# Patient Record
Sex: Male | Born: 1990 | Race: Black or African American | Hispanic: No | Marital: Single | Smoking: Current some day smoker
Health system: Southern US, Community
[De-identification: ages and names within clinical notes are randomized; demographics above are authoritative.]

## PROBLEM LIST (undated history)

## (undated) DIAGNOSIS — F1020 Alcohol dependence, uncomplicated: Secondary | ICD-10-CM

---

## 2012-02-25 ENCOUNTER — Emergency Department (HOSPITAL_BASED_OUTPATIENT_CLINIC_OR_DEPARTMENT_OTHER)
Admission: EM | Admit: 2012-02-25 | Discharge: 2012-02-25 | Disposition: A | Discharge status: Home / Self Care | Payer: Self-pay | Attending: Emergency Medicine | Admitting: Emergency Medicine

## 2012-02-25 ENCOUNTER — Encounter (HOSPITAL_BASED_OUTPATIENT_CLINIC_OR_DEPARTMENT_OTHER): Payer: Self-pay | Admitting: Family Medicine

## 2012-02-25 DIAGNOSIS — J069 Acute upper respiratory infection, unspecified: Secondary | ICD-10-CM | POA: Insufficient documentation

## 2012-02-25 DIAGNOSIS — F17200 Nicotine dependence, unspecified, uncomplicated: Secondary | ICD-10-CM | POA: Insufficient documentation

## 2012-02-25 NOTE — ED Provider Notes (Signed)
History     CSN: [id number]  Arrival date & time 02/25/12  1031   First MD Initiated Contact with Patient 02/25/12 1303      Chief Complaint  Patient presents with  . Cough    (Consider location/radiation/quality/duration/timing/severity/associated sxs/prior treatment) Patient is a 21 y.o. male presenting with cough. The history is provided by the patient. No language interpreter was used.  Cough This is a new problem. The current episode started 2 days ago. The problem occurs constantly. The problem has been gradually worsening. The cough is non-productive. There has been no fever. Pertinent negatives include no chest pain. He has tried nothing for the symptoms. He is not a smoker. His past medical history does not include bronchitis or pneumonia.    History reviewed. No pertinent past medical history.  History reviewed. No pertinent past surgical history.  No family history on file.  History  Substance Use Topics  . Smoking status: Current Every Day Smoker  . Smokeless tobacco: Not on file  . Alcohol Use: Yes      Review of Systems  Respiratory: Positive for cough.   Cardiovascular: Negative for chest pain.  All other systems reviewed and are negative.    Allergies  Review of patient's allergies indicates no known allergies.  Home Medications  No current outpatient prescriptions on file.  BP 115/74  Pulse 97  Temp 98.3 F (36.8 C) (Oral)  Resp 16  Ht 5\' 11"  (1.803 m)  Wt 165 lb (74.844 kg)  BMI 23.01 kg/m2  SpO2 98%  Physical Exam  Nursing note and vitals reviewed. Constitutional: He is oriented to person, place, and time. He appears well-developed and well-nourished.  HENT:  Head: Normocephalic and atraumatic.  Eyes: Conjunctivae normal and EOM are normal. Pupils are equal, round, and reactive to light.  Neck: Normal range of motion. Neck supple.  Cardiovascular: Normal rate and normal heart sounds.   Pulmonary/Chest: Effort normal.  Abdominal:  Soft.  Musculoskeletal: Normal range of motion.  Neurological: He is alert and oriented to person, place, and time. He has normal reflexes.  Skin: Skin is warm.  Psychiatric: He has a normal mood and affect.    ED Course  Procedures (including critical care time)  Labs Reviewed - No data to display No results found.   No diagnosis found.    MDM  Pt advised illness seems viral.  I advised tylenol for backaches.  Follow up if needed       Elson Areas, [place] 02/25/12 1344

## 2012-02-25 NOTE — ED Notes (Signed)
Pt c/o cough, congestion and body aches x 2 days. No fever at present. Denies n/v/d. Breathing is regular and non-labored.

## 2012-02-25 NOTE — ED Provider Notes (Signed)
Medical screening examination/treatment/procedure(s) were performed by non-physician practitioner and as supervising physician I was immediately available for consultation/collaboration.  Jones Skene, M.D.     Jones Skene, MD 02/25/12 [phone]

## 2012-03-06 ENCOUNTER — Emergency Department (HOSPITAL_BASED_OUTPATIENT_CLINIC_OR_DEPARTMENT_OTHER)
Admission: EM | Admit: 2012-03-06 | Discharge: 2012-03-06 | Disposition: A | Discharge status: Home / Self Care | Payer: Self-pay | Attending: Emergency Medicine | Admitting: Emergency Medicine

## 2012-03-06 ENCOUNTER — Encounter (HOSPITAL_BASED_OUTPATIENT_CLINIC_OR_DEPARTMENT_OTHER): Payer: Self-pay | Admitting: *Deleted

## 2012-03-06 ENCOUNTER — Emergency Department (HOSPITAL_BASED_OUTPATIENT_CLINIC_OR_DEPARTMENT_OTHER): Payer: Self-pay

## 2012-03-06 DIAGNOSIS — Y92009 Unspecified place in unspecified non-institutional (private) residence as the place of occurrence of the external cause: Secondary | ICD-10-CM | POA: Insufficient documentation

## 2012-03-06 DIAGNOSIS — S60229A Contusion of unspecified hand, initial encounter: Secondary | ICD-10-CM | POA: Insufficient documentation

## 2012-03-06 DIAGNOSIS — W2209XA Striking against other stationary object, initial encounter: Secondary | ICD-10-CM | POA: Insufficient documentation

## 2012-03-06 DIAGNOSIS — T148XXA Other injury of unspecified body region, initial encounter: Secondary | ICD-10-CM

## 2012-03-06 DIAGNOSIS — Y939 Activity, unspecified: Secondary | ICD-10-CM | POA: Insufficient documentation

## 2012-03-06 DIAGNOSIS — F17200 Nicotine dependence, unspecified, uncomplicated: Secondary | ICD-10-CM | POA: Insufficient documentation

## 2012-03-06 MED ORDER — NAPROXEN 375 MG PO TABS: 375.0000 mg | ORAL_TABLET | Freq: Two times a day (BID) | ORAL | Status: DC

## 2012-03-06 NOTE — ED Notes (Signed)
Pt c/o right hand pain after hitting a wall, swelling noted. +PMS

## 2012-03-06 NOTE — ED Notes (Signed)
MD at bedside. 

## 2012-03-06 NOTE — ED Provider Notes (Signed)
History     CSN: [id number]  Arrival date & time 03/06/12  0157   First MD Initiated Contact with Patient 03/06/12 0407      Chief Complaint  Patient presents with  . Hand Injury    (Consider location/radiation/quality/duration/timing/severity/associated sxs/prior treatment) Patient is a 22 y.o. male presenting with hand injury. The history is provided by the patient. No language interpreter was used.  Hand Injury  The incident occurred 1 to 2 hours ago. The incident occurred at home. Injury mechanism: hit a wall with his right hand. The pain is present in the right hand. The quality of the pain is described as aching. The pain is severe. The pain has been constant since the incident. Pertinent negatives include no fever. He reports no foreign bodies present. The symptoms are aggravated by movement and palpation. He has tried nothing for the symptoms. The treatment provided no relief.    History reviewed. No pertinent past medical history.  History reviewed. No pertinent past surgical history.  History reviewed. No pertinent family history.  History  Substance Use Topics  . Smoking status: Current Every Day Smoker  . Smokeless tobacco: Not on file  . Alcohol Use: Yes      Review of Systems  Constitutional: Negative for fever.  All other systems reviewed and are negative.    Allergies  Review of patient's allergies indicates no known allergies.  Home Medications  No current outpatient prescriptions on file.  BP 141/69  Pulse 98  Temp 98.4 F (36.9 C) (Oral)  Resp 18  Ht 5\' 11"  (1.803 m)  Wt 165 lb (74.844 kg)  BMI 23.01 kg/m2  SpO2 99%  Physical Exam  Constitutional: He is oriented to person, place, and time. He appears well-developed and well-nourished. No distress.  HENT:  Head: Normocephalic and atraumatic.  Eyes: Conjunctivae normal and EOM are normal.  Neck: Normal range of motion. Neck supple.  Cardiovascular: Normal rate, regular rhythm and intact  distal pulses.   Pulmonary/Chest: Effort normal and breath sounds normal. He has no wheezes. He has no rales.  Abdominal: Soft. Bowel sounds are normal. There is no tenderness. There is no rebound and no guarding.  Musculoskeletal: Normal range of motion.       No snuff box tenderness on the right FROM of the right hand right hand neurovascularly intact cap refill < 2 sec to all fingers.  Contusions to the DIP of 4 fingers  Neurological: He is alert and oriented to person, place, and time. He has normal reflexes.  Skin: Skin is warm and dry.  Psychiatric: He has a normal mood and affect.    ED Course  Procedures (including critical care time)  Labs Reviewed - No data to display Dg Hand Complete Right  03/06/2012  *RADIOLOGY REPORT*  Clinical Data: Right hand injury.  RIGHT HAND - COMPLETE 3+ VIEW  Comparison: None.  Findings: There is no evidence of fracture or dislocation.  The joint spaces are preserved; the soft tissues are unremarkable in appearance.  The carpal rows are intact, and demonstrate normal alignment.  Mild negative ulnar variance is noted.  IMPRESSION: No evidence of fracture or dislocation.   Original Report Authenticated By: Tonia Ghent, M.D.      No diagnosis found.    MDM  Ice elevate NSAIDs for pain       Nelma Phagan K Danessa Mensch-Rasch, MD 03/06/12 [phone]

## 2012-11-12 ENCOUNTER — Emergency Department (HOSPITAL_BASED_OUTPATIENT_CLINIC_OR_DEPARTMENT_OTHER)
Admission: EM | Admit: 2012-11-12 | Discharge: 2012-11-12 | Disposition: A | Discharge status: Home / Self Care | Payer: Self-pay | Attending: Emergency Medicine | Admitting: Emergency Medicine

## 2012-11-12 ENCOUNTER — Encounter (HOSPITAL_BASED_OUTPATIENT_CLINIC_OR_DEPARTMENT_OTHER): Payer: Self-pay | Admitting: *Deleted

## 2012-11-12 DIAGNOSIS — Z791 Long term (current) use of non-steroidal anti-inflammatories (NSAID): Secondary | ICD-10-CM | POA: Insufficient documentation

## 2012-11-12 DIAGNOSIS — R05 Cough: Secondary | ICD-10-CM | POA: Insufficient documentation

## 2012-11-12 DIAGNOSIS — R059 Cough, unspecified: Secondary | ICD-10-CM | POA: Insufficient documentation

## 2012-11-12 DIAGNOSIS — F17200 Nicotine dependence, unspecified, uncomplicated: Secondary | ICD-10-CM | POA: Insufficient documentation

## 2012-11-12 DIAGNOSIS — R51 Headache: Secondary | ICD-10-CM | POA: Insufficient documentation

## 2012-11-12 DIAGNOSIS — R071 Chest pain on breathing: Secondary | ICD-10-CM | POA: Insufficient documentation

## 2012-11-12 DIAGNOSIS — Z113 Encounter for screening for infections with a predominantly sexual mode of transmission: Secondary | ICD-10-CM | POA: Insufficient documentation

## 2012-11-12 DIAGNOSIS — R0789 Other chest pain: Secondary | ICD-10-CM | POA: Insufficient documentation

## 2012-11-12 LAB — URINALYSIS, ROUTINE W REFLEX MICROSCOPIC
Bilirubin Urine: NEGATIVE
Ketones, ur: NEGATIVE mg/dL
Nitrite: NEGATIVE
Specific Gravity, Urine: 1.024 (ref 1.005–1.030)
Urobilinogen, UA: 0.2 mg/dL (ref 0.0–1.0)

## 2012-11-12 MED ORDER — IBUPROFEN 200 MG PO TABS
600.0000 mg | ORAL_TABLET | Freq: Once | ORAL | Status: AC
  Administered 2012-11-12: 600 mg via ORAL
  Filled 2012-11-12: qty 1

## 2012-11-12 MED ORDER — IBUPROFEN 600 MG PO TABS: 600.0000 mg | ORAL_TABLET | Freq: Four times a day (QID) | ORAL | Status: DC | PRN

## 2012-11-12 NOTE — ED Provider Notes (Signed)
CSN: [id number]     Arrival date & time 11/12/12  1827 History   First MD Initiated Contact with Patient 11/12/12 1926     Chief Complaint  Patient presents with  . Headache   (Consider location/radiation/quality/duration/timing/severity/associated sxs/prior Treatment) HPI Patient presents with several complaints. Personally to be screened for STDs. The patient's history is such that with one partner doesn't use contraception. His partner has not been diagnosed with transmitted disease. He has never been diagnosed with an essentially transmitted disease. He denies penal discharge, dysuria, testicular pain or swelling.  Patient also complains of a mild left parietal headache that started roughly one hour ago. Patient has had similar headaches to this in the past. Headache was gradual onset. He has no photophobia nausea vomiting. He has no weakness vision changes or numbness. Patient states it has been several hours since he last had a cigarette. He routinely gets headaches similar to this when he does not smoke extended period.  Patient also complains of 2 weeks of left lower rib pain. Does not remember any definite trauma. Has no shortness of breath but does have a mild cough. There is no yellow-green sputum production. He has no fevers or chills. Pain is worse with palpation of the left lateral ribs. He has no portion of the swelling, no recent travel no surgeries.Factors for DVT/PE.  History reviewed. No pertinent past medical history. History reviewed. No pertinent past surgical history. No family history on file. History  Substance Use Topics  . Smoking status: Current Some Day Smoker    Types: Cigarettes  . Smokeless tobacco: Not on file  . Alcohol Use: Yes    Review of Systems  Constitutional: Negative for fever and chills.  HENT: Negative for sore throat, neck pain and neck stiffness.   Respiratory: Positive for cough. Negative for shortness of breath.   Cardiovascular: Positive  for chest pain. Negative for palpitations and leg swelling.  Gastrointestinal: Negative for nausea, vomiting and abdominal pain.  Genitourinary: Negative for dysuria, discharge, penile swelling, scrotal swelling, penile pain and testicular pain.  Musculoskeletal: Negative for myalgias and back pain.  Skin: Negative for rash and wound.  Neurological: Positive for headaches. Negative for dizziness, weakness, light-headedness and numbness.  All other systems reviewed and are negative.    Allergies  Review of patient's allergies indicates no known allergies.  Home Medications   Current Outpatient Rx  Name  Route  Sig  Dispense  Refill  . naproxen (NAPROSYN) 375 MG tablet   Oral   Take 1 tablet (375 mg total) by mouth 2 (two) times daily.   20 tablet   0    BP 144/75  Pulse 80  Temp(Src) 98.9 F (37.2 C) (Oral)  Resp 16  Ht 5\' 11"  (1.803 m)  Wt 180 lb (81.647 kg)  BMI 25.12 kg/m2  SpO2 99% Physical Exam  Nursing note and vitals reviewed. Constitutional: He is oriented to person, place, and time. He appears well-developed and well-nourished. No distress.  HENT:  Head: Normocephalic and atraumatic.  Mouth/Throat: Oropharynx is clear and moist. No oropharyngeal exudate.  Eyes: EOM are normal. Pupils are equal, round, and reactive to light.  Neck: Normal range of motion. Neck supple.  No meningeal signs  Cardiovascular: Normal rate and regular rhythm.   Pulmonary/Chest: Effort normal and breath sounds normal. No respiratory distress. He has no wheezes. He has no rales. He exhibits tenderness (mild tenderness to palpation of his left lower lateral ribs. No obvious deformity or crepitance.).  Abdominal: Soft. Bowel sounds are normal. He exhibits no distension and no mass. There is no tenderness. There is no rebound and no guarding.  Genitourinary: Penis normal.  No penal or testicular pain. The penal discharge. No inguinal lymphadenopathy.  Musculoskeletal: Normal range of motion.  He exhibits no edema and no tenderness.  Lymphadenopathy:    He has no cervical adenopathy.  Neurological: He is alert and oriented to person, place, and time.  Patient is alert and oriented x3 with clear, goal oriented speech. Patient has 5/5 motor in all extremities. Sensation is intact to light touch. Patient has a normal gait and walks without assistance.   Skin: Skin is warm and dry. No rash noted. No erythema.  Psychiatric: He has a normal mood and affect. His behavior is normal.    ED Course  Procedures (including critical care time) Labs Review Labs Reviewed  GC/CHLAMYDIA PROBE AMP  URINALYSIS, ROUTINE W REFLEX MICROSCOPIC   Imaging Review No results found.  MDM  GC and Chlamydia cultures obtained. Patient advised to call to medical records in 3 days results. Lateral chest pain likely rib contusion versus muscle strain. Do not suspect pneumonia or PE. Headache but no concerning features. Possibly due to nicotine withdrawal. We'll treat chest pain and headache with NSAIDs. Return cautions have been given   Loren Racer, MD 11/12/12 [phone]

## 2012-11-12 NOTE — ED Notes (Signed)
Pt reports he is here to be checked for an STD- denies any penile drainage or painful urination

## 2012-11-12 NOTE — ED Notes (Signed)
Headache. Wants a STD check.

## 2013-10-04 ENCOUNTER — Encounter (HOSPITAL_BASED_OUTPATIENT_CLINIC_OR_DEPARTMENT_OTHER): Payer: Self-pay | Admitting: Emergency Medicine

## 2013-10-04 ENCOUNTER — Emergency Department (HOSPITAL_BASED_OUTPATIENT_CLINIC_OR_DEPARTMENT_OTHER)
Admission: EM | Admit: 2013-10-04 | Discharge: 2013-10-04 | Disposition: A | Discharge status: Home / Self Care | Payer: Self-pay | Attending: Emergency Medicine | Admitting: Emergency Medicine

## 2013-10-04 DIAGNOSIS — T675XXA Heat exhaustion, unspecified, initial encounter: Secondary | ICD-10-CM | POA: Insufficient documentation

## 2013-10-04 DIAGNOSIS — Z791 Long term (current) use of non-steroidal anti-inflammatories (NSAID): Secondary | ICD-10-CM | POA: Insufficient documentation

## 2013-10-04 DIAGNOSIS — R55 Syncope and collapse: Secondary | ICD-10-CM | POA: Insufficient documentation

## 2013-10-04 DIAGNOSIS — X30XXXA Exposure to excessive natural heat, initial encounter: Secondary | ICD-10-CM | POA: Insufficient documentation

## 2013-10-04 DIAGNOSIS — Y9389 Activity, other specified: Secondary | ICD-10-CM | POA: Insufficient documentation

## 2013-10-04 DIAGNOSIS — Y9289 Other specified places as the place of occurrence of the external cause: Secondary | ICD-10-CM | POA: Insufficient documentation

## 2013-10-04 DIAGNOSIS — R1013 Epigastric pain: Secondary | ICD-10-CM | POA: Insufficient documentation

## 2013-10-04 DIAGNOSIS — F17200 Nicotine dependence, unspecified, uncomplicated: Secondary | ICD-10-CM | POA: Insufficient documentation

## 2013-10-04 DIAGNOSIS — Y990 Civilian activity done for income or pay: Secondary | ICD-10-CM | POA: Insufficient documentation

## 2013-10-04 LAB — URINALYSIS, ROUTINE W REFLEX MICROSCOPIC
Bilirubin Urine: NEGATIVE
Glucose, UA: NEGATIVE mg/dL
Hgb urine dipstick: NEGATIVE
Ketones, ur: NEGATIVE mg/dL
LEUKOCYTES UA: NEGATIVE
NITRITE: NEGATIVE
PROTEIN: NEGATIVE mg/dL
SPECIFIC GRAVITY, URINE: 1.011 (ref 1.005–1.030)
UROBILINOGEN UA: 1 mg/dL (ref 0.0–1.0)
pH: 5.5 (ref 5.0–8.0)

## 2013-10-04 LAB — LIPASE, BLOOD: LIPASE: 19 U/L (ref 11–59)

## 2013-10-04 LAB — CBC WITH DIFFERENTIAL/PLATELET
BASOS ABS: 0 10*3/uL (ref 0.0–0.1)
Basophils Relative: 0 % (ref 0–1)
EOS PCT: 1 % (ref 0–5)
Eosinophils Absolute: 0.1 10*3/uL (ref 0.0–0.7)
HCT: 43.7 % (ref 39.0–52.0)
Hemoglobin: 15 g/dL (ref 13.0–17.0)
LYMPHS ABS: 2.8 10*3/uL (ref 0.7–4.0)
LYMPHS PCT: 27 % (ref 12–46)
MCH: 27.4 pg (ref 26.0–34.0)
MCHC: 34.3 g/dL (ref 30.0–36.0)
MCV: 79.9 fL (ref 78.0–100.0)
Monocytes Absolute: 0.9 10*3/uL (ref 0.1–1.0)
Monocytes Relative: 9 % (ref 3–12)
NEUTROS ABS: 6.5 10*3/uL (ref 1.7–7.7)
NEUTROS PCT: 63 % (ref 43–77)
PLATELETS: 288 10*3/uL (ref 150–400)
RBC: 5.47 MIL/uL (ref 4.22–5.81)
RDW: 13.2 % (ref 11.5–15.5)
WBC: 10.4 10*3/uL (ref 4.0–10.5)

## 2013-10-04 LAB — COMPREHENSIVE METABOLIC PANEL
ALK PHOS: 55 U/L (ref 39–117)
ALT: 20 U/L (ref 0–53)
ANION GAP: 14 (ref 5–15)
AST: 26 U/L (ref 0–37)
Albumin: 4.3 g/dL (ref 3.5–5.2)
BUN: 9 mg/dL (ref 6–23)
CALCIUM: 10.1 mg/dL (ref 8.4–10.5)
CO2: 25 meq/L (ref 19–32)
Chloride: 102 mEq/L (ref 96–112)
Creatinine, Ser: 1.1 mg/dL (ref 0.50–1.35)
GFR calc Af Amer: >90 mL/min (ref >90)
GLUCOSE: 86 mg/dL (ref 70–99)
POTASSIUM: 3.8 meq/L (ref 3.7–5.3)
SODIUM: 141 meq/L (ref 137–147)
Total Bilirubin: 0.3 mg/dL (ref 0.3–1.2)
Total Protein: 7.5 g/dL (ref 6.0–8.3)

## 2013-10-04 LAB — CK: Total CK: 341 U/L — ABNORMAL HIGH (ref 7–232)

## 2013-10-04 MED ORDER — SODIUM CHLORIDE 0.9 % IV BOLUS (SEPSIS)
1000.0000 mL | Freq: Once | INTRAVENOUS | Status: AC
  Administered 2013-10-04: 1000 mL via INTRAVENOUS

## 2013-10-04 NOTE — Discharge Instructions (Signed)
Drink plenty of fluids and get plenty of rest for the next 2 days.  Return to the emergency department if he develops severe pain, bloody stool, high fever, or other new and concerning symptoms.   Syncope Syncope is a medical term for fainting or passing out. This means you lose consciousness and drop to the ground. People are generally unconscious for less than 5 minutes. You may have some muscle twitches for up to 15 seconds before waking up and returning to normal. Syncope occurs more often in older adults, but it can happen to anyone. While most causes of syncope are not dangerous, syncope can be a sign of a serious medical problem. It is important to seek medical care.  CAUSES  Syncope is caused by a sudden drop in blood flow to the brain. The specific cause is often not determined. Factors that can bring on syncope include:  Taking medicines that lower blood pressure.  Sudden changes in posture, such as standing up quickly.  Taking more medicine than prescribed.  Standing in one place for too long.  Seizure disorders.  Dehydration and excessive exposure to heat.  Low blood sugar (hypoglycemia).  Straining to have a bowel movement.  Heart disease, irregular heartbeat, or other circulatory problems.  Fear, emotional distress, seeing blood, or severe pain. SYMPTOMS  Right before fainting, you may:  Feel dizzy or light-headed.  Feel nauseous.  See all white or all black in your field of vision.  Have cold, clammy skin. DIAGNOSIS  Your health care provider will ask about your symptoms, perform a physical exam, and perform an electrocardiogram (ECG) to record the electrical activity of your heart. Your health care provider may also perform other heart or blood tests to determine the cause of your syncope which may include:  Transthoracic echocardiogram (TTE). During echocardiography, sound waves are used to evaluate how blood flows through your heart.  Transesophageal  echocardiogram (TEE).  Cardiac monitoring. This allows your health care provider to monitor your heart rate and rhythm in real time.  Holter monitor. This is a portable device that records your heartbeat and can help diagnose heart arrhythmias. It allows your health care provider to track your heart activity for several days, if needed.  Stress tests by exercise or by giving medicine that makes the heart beat faster. TREATMENT  In most cases, no treatment is needed. Depending on the cause of your syncope, your health care provider may recommend changing or stopping some of your medicines. HOME CARE INSTRUCTIONS  Have someone stay with you until you feel stable.  Do not drive, use machinery, or play sports until your health care provider says it is okay.  Keep all follow-up appointments as directed by your health care provider.  Lie down right away if you start feeling like you might faint. Breathe deeply and steadily. Wait until all the symptoms have passed.  Drink enough fluids to keep your urine clear or pale yellow.  If you are taking blood pressure or heart medicine, get up slowly and take several minutes to sit and then stand. This can reduce dizziness. SEEK IMMEDIATE MEDICAL CARE IF:   You have a severe headache.  You have unusual pain in the chest, abdomen, or back.  You are bleeding from your mouth or rectum, or you have black or tarry stool.  You have an irregular or very fast heartbeat.  You have pain with breathing.  You have repeated fainting or seizure-like jerking during an episode.  You faint when  sitting or lying down.  You have confusion.  You have trouble walking.  You have severe weakness.  You have vision problems. If you fainted, call your local emergency services (911 in [place] Do not drive yourself to the hospital.  MAKE SURE YOU:  Understand these instructions.  Will watch your condition.  Will get help right away if you are not doing well  or get worse. Document Released: 02/18/2005 Document Revised: 02/23/2013 Document Reviewed: 04/19/2011 [facility] Patient Information 2015 [place], [place]. This information is not intended to replace advice given to you by your health care provider. Make sure you discuss any questions you have with your health care provider.  Heat-Related Illness Heat-related illnesses occur when the body is unable to properly cool itself. The body normally cools itself by sweating. However, under some conditions sweating is not enough. In these cases, a person's body temperature rises rapidly. Very high body temperatures may damage the brain or other vital organs. Some examples of heat-related illnesses include:  Heat stroke. This occurs when the body is unable to regulate its temperature. The body's temperature rises rapidly, the sweating mechanism fails, and the body is unable to cool down. Body temperature may rise to 106 F (41 C) or higher within 10 to 15 minutes. Heat stroke can cause death or permanent disability if emergency treatment is not provided.  Heat exhaustion. This is a milder form of heat-related illness that can develop after several days of exposure to high temperatures and not enough fluids. It is the body's response to an excessive loss of the water and salt contained in sweat.  Heat cramps. These usually affect people who sweat a lot during heavy activity. This sweating drains the body's salt and moisture. The low salt level in the muscles causes painful cramps. Heat cramps may also be a symptom of heat exhaustion. Heat cramps usually occur in the abdomen, arms, or legs. Get medical attention for cramps if you have heart problems or are on a low-sodium diet. Those that are at greatest risk for heat-related illnesses include:   The elderly.  Infant and the very young.  People with mental illness and chronic diseases.  People who are overweight (obese).  Young and healthy people can even  succumb to heat if they participate in strenuous physical activities during hot weather. CAUSES  Several factors affect the body's ability to cool itself during extremely hot weather. When the humidity is high, sweat will not evaporate as quickly. This prevents the body from releasing heat quickly. Other factors that can affect the body's ability to cool down include:   Age.  Obesity.  Fever.  Dehydration.  Heart disease.  Mental illness.  Poor circulation.  Sunburn.  Prescription drug use.  Alcohol use. SYMPTOMS  Heat stroke: Warning signs of heat stroke vary, but may include:  An extremely high body temperature (above 103F orally).  A fast, strong pulse.  Dizziness.  Confusion.  Red, hot, and dry skin.  No sweating.  Throbbing headache.  Feeling sick to your stomach (nauseous).  Unconsciousness. Heat exhaustion: Warning signs of heat exhaustion include:  Heavy sweating.  Tiredness.  Headache.  Paleness.  Weakness.  Feeling sick to your stomach (nauseous) or vomiting.  Muscle cramps. Heat cramps  Muscle pains or spasms. TREATMENT  Heat stroke  Get into a cool environment. An indoor place that is air-conditioned may be best.  Take a cool shower or bath. Have someone around to make sure you are okay.  Take your temperature.  Make sure it is going down. Heat exhaustion  Drink plenty of fluids. Do not drink liquids that contain caffeine, alcohol, or large amounts of sugar. These cause you to lose more body fluid. Also, avoid very cold drinks. They can cause stomach cramps.  Get into a cool environment. An indoor place that is air-conditioned may be best.  Take a cool shower or bath. Have someone around to make sure you are okay.  Put on lightweight clothing. Heat cramps  Stop whatever activity you were doing. Do not attempt to do that activity for at least 3 hours after the cramps have gone away.  Get into a cool environment. An indoor  place that is air-conditioned may be best. HOME CARE INSTRUCTIONS  To protect your health when temperatures are extremely high, follow these tips:  During heavy exercise in a hot environment, drink two to four glasses (16-32 ounces) of cool fluids each hour. Do not wait until you are thirsty to drink. Warning: If your caregiver limits the amount of fluid you drink or has you on water pills, ask how much you should drink while the weather is hot.  Do not drink liquids that contain caffeine, alcohol, or large amounts of sugar. These cause you to lose more body fluid.  Avoid very cold drinks. They can cause stomach cramps.  Wear appropriate clothing. Choose lightweight, light-colored, loose-fitting clothing.  If you must be outdoors, try to limit your outdoor activity to morning and evening hours. Try to rest often in shady areas.  If you are not used to working or exercising in a hot environment, start slowly and pick up the pace gradually.  Stay cool in an air-conditioned place if possible. If your home does not have air conditioning, go to the shopping mall or [employer].  Taking a cool shower or bath may help you cool off. SEEK MEDICAL CARE IF:   You see any of the symptoms listed above. You may be dealing with a life-threatening emergency.  Symptoms worsen or last longer than 1 hour.  Heat cramps do not get better in 1 hour. MAKE SURE YOU:   Understand these instructions.  Will watch your condition.  Will get help right away if you are not doing well or get worse. Document Released: 11/28/2007 Document Revised: 05/13/2011 Document Reviewed: 11/28/2007 [facility] Patient Information 2015 [place], [place]. This information is not intended to replace advice given to you by your health care provider. Make sure you discuss any questions you have with your health care provider.

## 2013-10-04 NOTE — ED Notes (Signed)
MD at bedside. 

## 2013-10-04 NOTE — ED Notes (Signed)
Abdominal pain and "passed out" PTA.  States he ate lunch, went outside at work to [employer] and "passed out".  Unwitnessed. States he was unconscious x 2-3 minutes witnessed.  No previous illness.  States he developed upper abdominal pain radiating to left chest wall.

## 2013-10-04 NOTE — ED Provider Notes (Signed)
CSN: [id number]     Arrival date & time 10/04/13  1232 History   First MD Initiated Contact with Patient 10/04/13 1253     Chief Complaint  Patient presents with  . Abdominal Pain     (Consider location/radiation/quality/duration/timing/severity/associated sxs/prior Treatment) HPI Comments: Patient is a 23 year old male with no significant past medical history. He presents today with complaints of epigastric cramping following an apparent syncopal episode at work. 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 He states he was riding a standing lawnmower when he suddenly blacked out. He was unconscious for approximately 2 minutes and was found by a coworker. He was then brought here for evaluation. He developed the upper abdominal cramping after waking up from this episode. It was quite hot outside today, however he believes he was drinking an adequate amount of fluid and felt well prior to this episode. He denies any biting of the tongue or cheek. He denies any incontinence of stool or urine.  Patient is a 23 y.o. male presenting with abdominal pain. The history is provided by the patient.  Abdominal Pain Pain location:  Epigastric Pain quality: cramping   Pain radiates to:  Does not radiate Pain severity:  Moderate Onset quality:  Sudden Duration:  1 hour Timing:  Constant Progression:  Partially resolved Chronicity:  New Relieved by:  Nothing Worsened by:  Nothing tried Ineffective treatments:  None tried   History reviewed. No pertinent past medical history. History reviewed. No pertinent past surgical history. No family history on file. History  Substance Use Topics  . Smoking status: Current Some Day Smoker    Types: Cigarettes  . Smokeless tobacco: Not on file  . Alcohol Use: Yes    Review of Systems  Gastrointestinal: Positive for abdominal pain.  All other systems reviewed and are negative.     Allergies  Review of patient's allergies  indicates no known allergies.  Home Medications   Prior to Admission medications   Medication Sig Start Date End Date Taking? Authorizing Provider  ibuprofen (ADVIL,MOTRIN) 600 MG tablet Take 1 tablet (600 mg total) by mouth every 6 (six) hours as needed for pain. 11/12/12   Loren Racer, MD  naproxen (NAPROSYN) 375 MG tablet Take 1 tablet (375 mg total) by mouth 2 (two) times daily. 03/06/12   April K Palumbo-Rasch, MD   BP 129/81  Pulse 92  Temp(Src) 98.2 F (36.8 C) (Oral)  Resp 16  Ht 5\' 11"  (1.803 m)  Wt 195 lb (88.451 kg)  BMI 27.21 kg/m2  SpO2 100% Physical Exam  Nursing note and vitals reviewed. Constitutional: He is oriented to person, place, and time. He appears well-developed and well-nourished. No distress.  HENT:  Head: Normocephalic and atraumatic.  Mouth/Throat: Oropharynx is clear and moist.  Eyes: EOM are normal. Pupils are equal, round, and reactive to light.  Neck: Normal range of motion. Neck supple.  Cardiovascular: Normal rate, regular rhythm and normal heart sounds.   No murmur heard. Pulmonary/Chest: Effort normal and breath sounds normal. No respiratory distress. He has no wheezes.  Abdominal: Soft. Bowel sounds are normal. He exhibits no distension. There is tenderness.  There is mild tenderness to palpation in the epigastrium with no rebound and no guarding.  Musculoskeletal: Normal range of motion. He exhibits no edema.  Lymphadenopathy:    He has no cervical adenopathy.  Neurological: He is alert and oriented to person, place, and time.  Skin: Skin is warm and dry. He is not  diaphoretic.    ED Course  Procedures (including critical care time) Labs Review Labs Reviewed  URINALYSIS, ROUTINE W REFLEX MICROSCOPIC - Abnormal; Notable for the following:    APPearance CLOUDY (*)    All other components within normal limits  CBC WITH DIFFERENTIAL  COMPREHENSIVE METABOLIC PANEL  LIPASE, BLOOD  CK    Imaging Review No results found.   EKG  Interpretation   Date/Time:  Monday October 04 2013 13:13:08 EDT Ventricular Rate:  80 PR Interval:  172 QRS Duration: 98 QT Interval:  358 QTC Calculation: 412 R Axis:   56 Text Interpretation:  Normal sinus rhythm Early repolarization Normal ECG  Confirmed by DELOS  MD, Kuba Shepherd ([id number]) on 10/04/2013 1:22:10 PM      MDM   Final diagnoses:  None    Patient presents after a syncopal episode while at work. 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 He woke up with epigastric abdominal discomfort, the cause of which I am uncertain. It has since resolved and he now feels fine. He was given 1 L of normal saline and laboratory studies returned essentially unremarkable. An EKG reveals early repolarization, however no other abnormality. At this point I feel as though he is appropriate for discharge. I advised him to drink plenty of fluids and get rest for the next 2 days. He is to return if he develops high fever worsening pain, bloody stool.    Geoffery Lyonsouglas Iverna Hammac, MD 10/04/13 1426

## 2015-11-27 ENCOUNTER — Encounter (HOSPITAL_BASED_OUTPATIENT_CLINIC_OR_DEPARTMENT_OTHER): Payer: Self-pay | Admitting: *Deleted

## 2015-11-27 ENCOUNTER — Emergency Department (HOSPITAL_BASED_OUTPATIENT_CLINIC_OR_DEPARTMENT_OTHER)
Admission: EM | Admit: 2015-11-27 | Discharge: 2015-11-27 | Disposition: A | Discharge status: Home / Self Care | Payer: Self-pay | Attending: Dermatology | Admitting: Dermatology

## 2015-11-27 DIAGNOSIS — R1110 Vomiting, unspecified: Secondary | ICD-10-CM | POA: Insufficient documentation

## 2015-11-27 DIAGNOSIS — Z5321 Procedure and treatment not carried out due to patient leaving prior to being seen by health care provider: Secondary | ICD-10-CM | POA: Insufficient documentation

## 2015-11-27 DIAGNOSIS — F17210 Nicotine dependence, cigarettes, uncomplicated: Secondary | ICD-10-CM | POA: Insufficient documentation

## 2015-11-27 NOTE — ED Triage Notes (Signed)
N/V/D x 2 days

## 2018-09-28 ENCOUNTER — Emergency Department (HOSPITAL_BASED_OUTPATIENT_CLINIC_OR_DEPARTMENT_OTHER)
Admission: EM | Admit: 2018-09-28 | Discharge: 2018-09-28 | Disposition: A | Discharge status: Home / Self Care | Payer: No Typology Code available for payment source | Attending: Emergency Medicine | Admitting: Emergency Medicine

## 2018-09-28 ENCOUNTER — Other Ambulatory Visit: Payer: Self-pay

## 2018-09-28 ENCOUNTER — Encounter (HOSPITAL_BASED_OUTPATIENT_CLINIC_OR_DEPARTMENT_OTHER): Payer: Self-pay | Admitting: *Deleted

## 2018-09-28 ENCOUNTER — Emergency Department (HOSPITAL_BASED_OUTPATIENT_CLINIC_OR_DEPARTMENT_OTHER): Payer: No Typology Code available for payment source

## 2018-09-28 DIAGNOSIS — Y9389 Activity, other specified: Secondary | ICD-10-CM | POA: Diagnosis not present

## 2018-09-28 DIAGNOSIS — F17210 Nicotine dependence, cigarettes, uncomplicated: Secondary | ICD-10-CM | POA: Insufficient documentation

## 2018-09-28 DIAGNOSIS — Y929 Unspecified place or not applicable: Secondary | ICD-10-CM | POA: Insufficient documentation

## 2018-09-28 DIAGNOSIS — S298XXA Other specified injuries of thorax, initial encounter: Secondary | ICD-10-CM

## 2018-09-28 DIAGNOSIS — S29001A Unspecified injury of muscle and tendon of front wall of thorax, initial encounter: Secondary | ICD-10-CM | POA: Diagnosis present

## 2018-09-28 DIAGNOSIS — Y990 Civilian activity done for income or pay: Secondary | ICD-10-CM | POA: Diagnosis not present

## 2018-09-28 DIAGNOSIS — W240XXA Contact with lifting devices, not elsewhere classified, initial encounter: Secondary | ICD-10-CM | POA: Insufficient documentation

## 2018-09-28 DIAGNOSIS — S20211A Contusion of right front wall of thorax, initial encounter: Secondary | ICD-10-CM | POA: Diagnosis not present

## 2018-09-28 MED ORDER — KETOROLAC TROMETHAMINE 30 MG/ML IJ SOLN
30.0000 mg | Freq: Once | INTRAMUSCULAR | Status: AC
  Administered 2018-09-28: 30 mg via INTRAMUSCULAR
  Filled 2018-09-28: qty 1

## 2018-09-28 MED ORDER — NAPROXEN 500 MG PO TABS: 500.0000 mg | ORAL_TABLET | Freq: Two times a day (BID) | ORAL | 0 refills | Status: DC

## 2018-09-28 NOTE — ED Triage Notes (Addendum)
C/o right rib pain from a work injury that happened pta. States he slipped in grease and hit his right ribcage into a metal railing. Denies any other injury. Pain with inspiration. Denies sob. Has not taken any medications pta. States he does not need a urine drug screen. Awaiting xray

## 2018-09-28 NOTE — ED Notes (Signed)
To x-ray

## 2018-09-28 NOTE — ED Notes (Signed)
Work note provided to pt.

## 2018-09-28 NOTE — ED Provider Notes (Signed)
[facility] Provider Note   CSN: [id number] Arrival date & time: 09/28/18  7322     History   Chief Complaint Chief Complaint  Patient presents with  . w/c fall    HPI Cory Tanner is a 28 y.o. male.     HPI  This is a 28 year old male who presents with injury from work.  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  Denies hitting his head or loss of consciousness.  Rates his pain at 5 out of 10 mostly in the right side of his chest.  Denies shortness of breath.  He has not taken anything for his pain.  Denies other injury.  History reviewed. No pertinent past medical history.  There are no active problems to display for this patient.   History reviewed. No pertinent surgical history.      Home Medications    Prior to Admission medications   Medication Sig Start Date End Date Taking? Authorizing Provider  naproxen (NAPROSYN) 500 MG tablet Take 1 tablet (500 mg total) by mouth 2 (two) times daily. 09/28/18   Krithi Bray, Barbette Hair, MD    Family History No family history on file.  Social History Social History   Tobacco Use  . Smoking status: Current Some Day Smoker    Types: Cigarettes  . Smokeless tobacco: Never Used  Substance Use Topics  . Alcohol use: Yes    Comment: occasional  . Drug use: Not Currently     Allergies   Patient has no known allergies.   Review of Systems Review of Systems  Constitutional: Negative for fever.  Respiratory: Negative for shortness of breath.   Cardiovascular: Positive for chest pain. Negative for leg swelling.  Gastrointestinal: Negative for abdominal pain, nausea and vomiting.  Skin: Negative for wound.  All other systems reviewed and are negative.    Physical Exam Updated Vital Signs BP (!) 145/86 (BP Location: Right Arm)   Pulse 98   Temp 98.7 F (37.1 C) (Oral)   Resp 14   Ht  1.803 m (5\' 11" )   Wt 102.1 kg   SpO2 98%   BMI 31.38 kg/m   Physical Exam Vitals signs and nursing note reviewed.  Constitutional:      Appearance: He is well-developed. He is not ill-appearing.  HENT:     Head: Normocephalic and atraumatic.     Mouth/Throat:     Mouth: Mucous membranes are moist.  Neck:     Musculoskeletal: Neck supple.  Cardiovascular:     Rate and Rhythm: Normal rate and regular rhythm.     Heart sounds: Normal heart sounds. No murmur.  Pulmonary:     Effort: Pulmonary effort is normal. No respiratory distress.     Breath sounds: Normal breath sounds. No wheezing.     Comments: Right chest wall tenderness to palpation, no overlying skin changes, no crepitus Chest:     Chest wall: Tenderness present.  Abdominal:     General: Bowel sounds are normal.     Palpations: Abdomen is soft.     Tenderness: There is no abdominal tenderness.  Skin:    General: Skin is warm and dry.  Neurological:     Mental Status: He is alert and oriented to person, place, and time.  Psychiatric:        Mood and Affect: Mood normal.      ED  Treatments / Results  Labs (all labs ordered are listed, but only abnormal results are displayed) Labs Reviewed - No data to display  EKG None  Radiology Dg Ribs Unilateral W/chest Right  Result Date: 09/28/2018 CLINICAL DATA:  Fall with right rib pain. EXAM: RIGHT RIBS AND CHEST - 3+ VIEW COMPARISON:  None. FINDINGS: No fracture or other bone lesions are seen involving the ribs. Small cervical ribs. There is no evidence of pneumothorax or pleural effusion. Both lungs are clear. Heart size and mediastinal contours are within normal limits. IMPRESSION: Negative. Electronically Signed   By: Marnee SpringJonathon  Watts M.D.   On: 09/28/2018 04:21    Procedures Procedures (including critical care time)  Medications Ordered in ED Medications  ketorolac (TORADOL) 30 MG/ML injection 30 mg (30 mg Intramuscular Given 09/28/18 0414)     Initial  Impression / Assessment and Plan / ED Course  I have reviewed the triage vital signs and the nursing notes.  Pertinent labs & imaging results that were available during my care of the patient were reviewed by me and considered in my medical decision making (see chart for details).        Patient presents with right rib pain after a fall at work.  He is overall nontoxic-appearing and vital signs are reassuring.  He has tenderness on exam without evidence of crepitus or overlying skin changes.  He has good breath sounds bilaterally.  Considerations include rib fracture, contusion.  Patient given pain medication.  Imaging does not show any evidence of rib fractures, pneumothorax.  Patient written to return to work with limited restrictions.  10 to 15 pound weight restriction for 3 days then he can return to normal activity.  After history, exam, and medical workup I feel the patient has been appropriately medically screened and is safe for discharge home. Pertinent diagnoses were discussed with the patient. Patient was given return precautions.   Final Clinical Impressions(s) / ED Diagnoses   Final diagnoses:  Contusion of rib on right side, initial encounter    ED Discharge Orders         Ordered    naproxen (NAPROSYN) 500 MG tablet  2 times daily     09/28/18 0426           Nalaysia Manganiello, Mayer Maskerourtney F, MD 09/28/18 [id number]

## 2018-09-28 NOTE — Discharge Instructions (Signed)
You were seen today for an injury sustained at work.  Your x-rays are negative.  You likely have some bruising.  Apply ice.  Take naproxen as needed for pain.  You may return to work without any significant restrictions.

## 2018-11-19 ENCOUNTER — Other Ambulatory Visit: Payer: Self-pay

## 2018-11-19 ENCOUNTER — Emergency Department (HOSPITAL_BASED_OUTPATIENT_CLINIC_OR_DEPARTMENT_OTHER)
Admission: EM | Admit: 2018-11-19 | Discharge: 2018-11-19 | Disposition: A | Discharge status: Home / Self Care | Payer: No Typology Code available for payment source | Attending: Emergency Medicine | Admitting: Emergency Medicine

## 2018-11-19 ENCOUNTER — Emergency Department (HOSPITAL_BASED_OUTPATIENT_CLINIC_OR_DEPARTMENT_OTHER): Payer: Self-pay | Attending: Emergency Medicine

## 2018-11-19 ENCOUNTER — Encounter (HOSPITAL_BASED_OUTPATIENT_CLINIC_OR_DEPARTMENT_OTHER): Payer: Self-pay | Admitting: *Deleted

## 2018-11-19 DIAGNOSIS — Y9389 Activity, other specified: Secondary | ICD-10-CM | POA: Insufficient documentation

## 2018-11-19 DIAGNOSIS — X500XXA Overexertion from strenuous movement or load, initial encounter: Secondary | ICD-10-CM | POA: Insufficient documentation

## 2018-11-19 DIAGNOSIS — Y990 Civilian activity done for income or pay: Secondary | ICD-10-CM | POA: Insufficient documentation

## 2018-11-19 DIAGNOSIS — S93401A Sprain of unspecified ligament of right ankle, initial encounter: Secondary | ICD-10-CM | POA: Insufficient documentation

## 2018-11-19 DIAGNOSIS — Z720 Tobacco use: Secondary | ICD-10-CM | POA: Insufficient documentation

## 2018-11-19 DIAGNOSIS — Y929 Unspecified place or not applicable: Secondary | ICD-10-CM | POA: Insufficient documentation

## 2018-11-19 NOTE — ED Provider Notes (Signed)
[facility] EMERGENCY DEPARTMENT Provider Note   CSN: [id number] Arrival date & time: 11/19/18  2126     History   Chief Complaint Chief Complaint  Patient presents with  . Ankle Injury    HPI Cory Tanner is a 28 y.o. male.     The history is provided by the patient. No language interpreter was used.  Ankle Injury   Cory Tanner is a 28 y.o. male who presents to the Emergency Department complaining of ankle pain. He presents to the emergency department complaining of pain to his right medial ankle. He states that on Monday he was at work and lifted a pallet and felt a pop to the medial aspect of his right ankle. That improved tendon today he was going to turn and he felt a similar pop in the ankle. The pain is located over the medial ankle and is worse with plantar flexion of the foot. He is able to bear weight. Pain is mild in nature. He has no medical problems and takes no medications. History reviewed. No pertinent past medical history.  There are no active problems to display for this patient.   History reviewed. No pertinent surgical history.      Home Medications    Prior to Admission medications   Medication Sig Start Date End Date Taking? Authorizing Provider  naproxen (NAPROSYN) 500 MG tablet Take 1 tablet (500 mg total) by mouth 2 (two) times daily. 09/28/18   Horton, Mayer Maskerourtney F, MD    Family History No family history on file.  Social History Social History   Tobacco Use  . Smoking status: Current Some Day Smoker    Packs/day: 0.00    Types: Cigarettes, Cigars  . Smokeless tobacco: Never Used  Substance Use Topics  . Alcohol use: Yes    Comment: occasional  . Drug use: Not Currently     Allergies   Patient has no known allergies.   Review of Systems Review of Systems  All other systems reviewed and are negative.    Physical Exam Updated Vital Signs BP 130/78   Pulse (!) 102   Temp 98.3 F (36.8 C) (Oral)   Resp 18    Ht 5\' 11"  (1.803 m)   Wt 104.3 kg   SpO2 98%   BMI 32.08 kg/m   Physical Exam Vitals signs and nursing note reviewed.  Constitutional:      Appearance: He is well-developed.  HENT:     Head: Normocephalic and atraumatic.  Cardiovascular:     Rate and Rhythm: Normal rate and regular rhythm.  Pulmonary:     Effort: Pulmonary effort is normal. No respiratory distress.  Musculoskeletal:     Comments: 2+ DP pulses bilaterally. There is mild tenderness just superior to the right medial malleolus without any appreciable swelling. Flexion extension is intact at the ankle. There is no step off or tenderness over the Achilles tendon.   Skin:    General: Skin is warm and dry.  Neurological:     Mental Status: He is alert and oriented to person, place, and time.  Psychiatric:        Behavior: Behavior normal.      ED Treatments / Results  Labs (all labs ordered are listed, but only abnormal results are displayed) Labs Reviewed - No data to display  EKG None  Radiology Dg Ankle Complete Right  Result Date: 11/19/2018 CLINICAL DATA:  Lateral ankle pain since a fall 3 days ago. EXAM: RIGHT ANKLE -  COMPLETE 3+ VIEW COMPARISON:  None. FINDINGS: There is no evidence of fracture, dislocation, or joint effusion. There is no evidence of arthropathy or other focal bone abnormality. Soft tissues are unremarkable. IMPRESSION: Negative. Electronically Signed   By: Lorriane Shire M.D.   On: 11/19/2018 22:06    Procedures Procedures (including critical care time)  Medications Ordered in ED Medications - No data to display   Initial Impression / Assessment and Plan / ED Course  I have reviewed the triage vital signs and the nursing notes.  Pertinent labs & imaging results that were available during my care of the patient were reviewed by me and considered in my medical decision making (see chart for details).       Patient here for evaluation of right ankle pain. He is neurovascular  intact on examination. No evidence of acute fracture, dislocation. Plan to place in ankle ASO for comfort. Discussed home care with ibuprofen. Discussed outpatient follow-up.  Final Clinical Impressions(s) / ED Diagnoses   Final diagnoses:  Sprain of right ankle, unspecified ligament, initial encounter    ED Discharge Orders    None       Quintella Reichert, MD 11/19/18 2218

## 2018-11-19 NOTE — ED Triage Notes (Signed)
Pt c/o fall at work injuring ankle x 2 hrs ago

## 2018-11-30 ENCOUNTER — Other Ambulatory Visit: Payer: Self-pay

## 2018-11-30 ENCOUNTER — Encounter (HOSPITAL_BASED_OUTPATIENT_CLINIC_OR_DEPARTMENT_OTHER): Payer: Self-pay

## 2018-11-30 ENCOUNTER — Emergency Department (HOSPITAL_BASED_OUTPATIENT_CLINIC_OR_DEPARTMENT_OTHER)
Admission: EM | Admit: 2018-11-30 | Discharge: 2018-11-30 | Disposition: A | Discharge status: Home / Self Care | Payer: Self-pay | Attending: Emergency Medicine | Admitting: Emergency Medicine

## 2018-11-30 DIAGNOSIS — J3089 Other allergic rhinitis: Secondary | ICD-10-CM | POA: Insufficient documentation

## 2018-11-30 DIAGNOSIS — F17210 Nicotine dependence, cigarettes, uncomplicated: Secondary | ICD-10-CM | POA: Insufficient documentation

## 2018-11-30 MED ORDER — EPINEPHRINE 0.3 MG/0.3ML IJ SOAJ: 0.3000 mg | INTRAMUSCULAR | 0 refills | Status: AC | PRN

## 2018-11-30 NOTE — Discharge Instructions (Addendum)
Please read instructions below. Take 25mg  of benadryl every 6 hours, and 20mg  of pepcid every 12 hours to help treat this suspected allergic reaction. If benadryl makes you too drowsy during the day, you can take a daytime allergy medicine such as zyrtec or claritin instead. Schedule an appointment with your PCP to follow up on your visit today. Return to the ER immediately for feeling your throat closing, swelling of your lips or tongue, difficulty breathing, or new or concerning symptoms.

## 2018-11-30 NOTE — ED Provider Notes (Signed)
[facility] EMERGENCY DEPARTMENT Provider Note   CSN: [id number] Arrival date & time: 11/30/18  1336     History   Chief Complaint Chief Complaint  Patient presents with  . Nausea    HPI Cory Tanner is a 28 y.o. male that significant past medical history, presenting to the emergency department with complaint of throat discomfort that began this morning.  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  This was treated with cat litter when a colleague of his poured cat litter over it.  He states he accidentally walked through the dust and inhaled some of the cat litter dust.  He has had similar reaction in the past to cat litter dust.  He felt a little bit nauseous later in the evening and had coughed up some darker sputum.  This morning he feels like he had some sputum production versus small amount of emesis.  He felt some associated throat discomfort this morning as well.  No over-the-counter treatments.  No difficulty breathing or swallowing, no swelling of lips or tongue, no fever, no cough or chest pain.  No rash.     The history is provided by the patient.    History reviewed. No pertinent past medical history.  There are no active problems to display for this patient.   History reviewed. No pertinent surgical history.      Home Medications    Prior to Admission medications   Medication Sig Start Date End Date Taking? Authorizing Provider  EPINEPHrine (EPIPEN 2-PAK) 0.3 mg/0.3 mL IJ SOAJ injection Inject 0.3 mLs (0.3 mg total) into the muscle as needed for anaphylaxis. 11/30/18   , [place] N, PA-C    Family History No family history on file.  Social History Social History   Tobacco Use  . Smoking status: Current Some Day Smoker    Packs/day: 0.00    Types: Cigarettes, Cigars  . Smokeless tobacco: Never Used  Substance Use Topics  . Alcohol use: Yes    Comment:  occasional  . Drug use: Not Currently     Allergies   Patient has no known allergies.   Review of Systems Review of Systems  Constitutional: Negative for fever.  HENT: Positive for sore throat. Negative for trouble swallowing and voice change.   Respiratory: Negative for cough, chest tightness, shortness of breath and stridor.   Skin: Negative for rash.  All other systems reviewed and are negative.    Physical Exam Updated Vital Signs BP 140/85 (BP Location: Left Arm)   Pulse 84   Temp 98.4 F (36.9 C) (Oral)   Resp 16   Ht 5\' 11"  (1.803 m)   Wt 110.2 kg   SpO2 100%   BMI 33.89 kg/m   Physical Exam Vitals signs and nursing note reviewed.  Constitutional:      General: He is not in acute distress.    Appearance: He is well-developed. He is not ill-appearing.  HENT:     Head: Normocephalic and atraumatic.     Mouth/Throat:     Mouth: Mucous membranes are moist.     Pharynx: Oropharynx is clear. No oropharyngeal exudate or posterior oropharyngeal erythema.     Comments: No oropharyngeal swelling.  No swelling of lips or tongue.  Tolerating secretions. Eyes:     Conjunctiva/sclera: Conjunctivae normal.  Neck:     Musculoskeletal: Normal range of motion and neck supple.  Cardiovascular:  Rate and Rhythm: Normal rate and regular rhythm.  Pulmonary:     Effort: Pulmonary effort is normal. No respiratory distress.     Breath sounds: Normal breath sounds. No stridor.  Abdominal:     General: Bowel sounds are normal.     Palpations: Abdomen is soft.     Tenderness: There is no abdominal tenderness.  Skin:    General: Skin is warm.     Findings: No rash.  Neurological:     Mental Status: He is alert.  Psychiatric:        Mood and Affect: Mood normal.        Behavior: Behavior normal.      ED Treatments / Results  Labs (all labs ordered are listed, but only abnormal results are displayed) Labs Reviewed - No data to display  EKG None  Radiology No  results found.  Procedures Procedures (including critical care time)  Medications Ordered in ED Medications - No data to display   Initial Impression / Assessment and Plan / ED Course  I have reviewed the triage vital signs and the nursing notes.  Pertinent labs & imaging results that were available during my care of the patient were reviewed by me and considered in my medical decision making (see chart for details).        Patient presenting with mild symptoms after inhaling some cat litter dust yesterday while at work.  He has had similar reaction in the past to cat litter dust.  He has no swelling of the lips or tongue, oropharynx appears normal.  Tolerating secretions.  Normal work of breathing.  Lungs are clear.  No rash.  Patient is well-appearing and in no distress.  Recommend symptomatic management, trial over-the-counter antihistamines as needed.  Return precautions discussed.  Safe for discharge.  Discussed results, findings, treatment and follow up. Patient advised of return precautions. Patient verbalized understanding and agreed with plan.   Final Clinical Impressions(s) / ED Diagnoses   Final diagnoses:  Allergic reaction to inhaled dust    ED Discharge Orders         Ordered    EPINEPHrine (EPIPEN 2-PAK) 0.3 mg/0.3 mL IJ SOAJ injection  As needed     11/30/18 1407           , [place] N, [place] 11/30/18 Lambert, Wonda Old[facility], MD 12/02/18 [phone]

## 2018-11-30 NOTE — ED Triage Notes (Signed)
Pt c/o nausea, light headed and throat closing after inhaling cat litter dust at work last night-NAD-steady gait

## 2018-12-07 ENCOUNTER — Encounter (HOSPITAL_BASED_OUTPATIENT_CLINIC_OR_DEPARTMENT_OTHER): Payer: Self-pay | Admitting: Emergency Medicine

## 2018-12-07 ENCOUNTER — Emergency Department (HOSPITAL_BASED_OUTPATIENT_CLINIC_OR_DEPARTMENT_OTHER)
Admission: EM | Admit: 2018-12-07 | Discharge: 2018-12-07 | Disposition: A | Discharge status: Home / Self Care | Payer: Self-pay | Attending: Emergency Medicine | Admitting: Emergency Medicine

## 2018-12-07 ENCOUNTER — Other Ambulatory Visit: Payer: Self-pay

## 2018-12-07 DIAGNOSIS — R112 Nausea with vomiting, unspecified: Secondary | ICD-10-CM | POA: Insufficient documentation

## 2018-12-07 DIAGNOSIS — Z720 Tobacco use: Secondary | ICD-10-CM | POA: Insufficient documentation

## 2018-12-07 DIAGNOSIS — R05 Cough: Secondary | ICD-10-CM | POA: Insufficient documentation

## 2018-12-07 DIAGNOSIS — R1013 Epigastric pain: Secondary | ICD-10-CM | POA: Insufficient documentation

## 2018-12-07 MED ORDER — ONDANSETRON HCL 4 MG PO TABS: 4.0000 mg | ORAL_TABLET | Freq: Three times a day (TID) | ORAL | 0 refills | Status: AC | PRN

## 2018-12-07 MED ORDER — ONDANSETRON 4 MG PO TBDP
4.0000 mg | ORAL_TABLET | Freq: Once | ORAL | Status: AC
  Administered 2018-12-07: 12:00:00 4 mg via ORAL
  Filled 2018-12-07: qty 1

## 2018-12-07 NOTE — ED Provider Notes (Signed)
[facility] Provider Note   CSN: [id number] Arrival date & time: 12/07/18  1124     History   Chief Complaint Chief Complaint  Patient presents with  . Headache  . Emesis  . Cough  . Abdominal Pain    HPI Cory Tanner is a 28 y.o. male.     HPI   28 year old male with nausea vomiting.  Patient went to a cookout on Friday evening.  The following day he developed nausea and vomiting.  He questions whether he may have had some tainted food.  After vomiting a few times he developed some epigastric/sternal discomfort.  He has had intermittent nausea for the past couple days.  He is requesting a work note.  No diarrhea.  No fevers or chills.  History reviewed. No pertinent past medical history.  There are no active problems to display for this patient.   History reviewed. No pertinent surgical history.      Home Medications    Prior to Admission medications   Medication Sig Start Date End Date Taking? Authorizing Provider  EPINEPHrine (EPIPEN 2-PAK) 0.3 mg/0.3 mL IJ SOAJ injection Inject 0.3 mLs (0.3 mg total) into the muscle as needed for anaphylaxis. 11/30/18   Robinson, [place] N, PA-C  ondansetron (ZOFRAN) 4 MG tablet Take 1 tablet (4 mg total) by mouth every 8 (eight) hours as needed for nausea or vomiting. 12/07/18   Virgel Manifold, MD    Family History No family history on file.  Social History Social History   Tobacco Use  . Smoking status: Current Some Day Smoker    Packs/day: 0.00    Types: Cigarettes, Cigars  . Smokeless tobacco: Never Used  Substance Use Topics  . Alcohol use: Yes    Comment: occasional  . Drug use: Not Currently     Allergies   Patient has no known allergies.   Review of Systems Review of Systems All systems reviewed and negative, other than as noted in HPI.   Physical Exam Updated Vital Signs BP (!) 161/97 (BP Location: Right Arm)   Pulse 88   Temp 98.5 F (36.9 C) (Oral)   Resp 18    Ht 5\' 11"  (1.803 m)   Wt 108.9 kg   SpO2 99%   BMI 33.47 kg/m   Physical Exam Vitals signs and nursing note reviewed.  Constitutional:      General: He is not in acute distress.    Appearance: He is well-developed. He is obese.  HENT:     Head: Normocephalic and atraumatic.  Eyes:     General:        Right eye: No discharge.        Left eye: No discharge.     Conjunctiva/sclera: Conjunctivae normal.  Neck:     Musculoskeletal: Neck supple.  Cardiovascular:     Rate and Rhythm: Normal rate and regular rhythm.     Heart sounds: Normal heart sounds. No murmur. No friction rub. No gallop.   Pulmonary:     Effort: Pulmonary effort is normal. No respiratory distress.     Breath sounds: Normal breath sounds.  Abdominal:     General: There is no distension.     Palpations: Abdomen is soft.     Tenderness: There is abdominal tenderness.     Comments: Minimal epigastric tenderness with no rebound or guarding.  Musculoskeletal:        General: No tenderness.  Skin:    General: Skin is warm and  dry.  Neurological:     Mental Status: He is alert.  Psychiatric:        Behavior: Behavior normal.        Thought Content: Thought content normal.      ED Treatments / Results  Labs (all labs ordered are listed, but only abnormal results are displayed) Labs Reviewed - No data to display  EKG None  Radiology No results found.  Procedures Procedures (including critical care time)  Medications Ordered in ED Medications  ondansetron (ZOFRAN-ODT) disintegrating tablet 4 mg (has no administration in time range)     Initial Impression / Assessment and Plan / ED Course  I have reviewed the triage vital signs and the nursing notes.  Pertinent labs & imaging results that were available during my care of the patient were reviewed by me and considered in my medical decision making (see chart for details).    28 year old male with intermittent nausea/vomiting.  Suspect viral  illness versus gastritis.  Minimal tenderness on exam.  Suspect that his abdominal pain/sternal discomfort is related to the vomiting.  He is afebrile.  Generally very well-appearing.  Afebrile.  Plan symptomatic treatment.  Work note provided.  Return precautions discussed.  Final Clinical Impressions(s) / ED Diagnoses   Final diagnoses:  Nausea and vomiting, intractability of vomiting not specified, unspecified vomiting type    ED Discharge Orders         Ordered    ondansetron (ZOFRAN) 4 MG tablet  Every 8 hours PRN     12/07/18 1219           Raeford Razor, MD 12/07/18 1222

## 2018-12-07 NOTE — ED Triage Notes (Signed)
Pt presents with c/o headache, vomiting, coughing and abdominal pain since Friday night.

## 2020-08-13 ENCOUNTER — Emergency Department (HOSPITAL_BASED_OUTPATIENT_CLINIC_OR_DEPARTMENT_OTHER)
Admission: EM | Admit: 2020-08-13 | Discharge: 2020-08-13 | Disposition: A | Discharge status: Home / Self Care | Payer: Self-pay | Attending: Emergency Medicine | Admitting: Emergency Medicine

## 2020-08-13 ENCOUNTER — Other Ambulatory Visit: Payer: Self-pay

## 2020-08-13 ENCOUNTER — Encounter (HOSPITAL_BASED_OUTPATIENT_CLINIC_OR_DEPARTMENT_OTHER): Payer: Self-pay | Admitting: Emergency Medicine

## 2020-08-13 DIAGNOSIS — F17210 Nicotine dependence, cigarettes, uncomplicated: Secondary | ICD-10-CM | POA: Insufficient documentation

## 2020-08-13 DIAGNOSIS — Y990 Civilian activity done for income or pay: Secondary | ICD-10-CM | POA: Insufficient documentation

## 2020-08-13 DIAGNOSIS — S6991XA Unspecified injury of right wrist, hand and finger(s), initial encounter: Secondary | ICD-10-CM

## 2020-08-13 DIAGNOSIS — W230XXA Caught, crushed, jammed, or pinched between moving objects, initial encounter: Secondary | ICD-10-CM | POA: Insufficient documentation

## 2020-08-13 DIAGNOSIS — S60021A Contusion of right index finger without damage to nail, initial encounter: Secondary | ICD-10-CM | POA: Insufficient documentation

## 2020-08-13 NOTE — ED Provider Notes (Signed)
[facility] EMERGENCY DEPARTMENT Provider Note   CSN: [id number] Arrival date & time: 08/13/20  1127     History Chief Complaint  Patient presents with   Finger Injury    Cory Tanner is a 30 y.o. male.  29 y.o male with no PMH presents to the ED with a chief complaint of right index injury x 3 days.  Patient reports he was at work when suddenly has had formed core caught on the distal aspect of his finger.  There is swelling along with bruising noted to the area.  He does report taking ibuprofen prior to arrival with improvement in his symptoms.  States that his girlfriend has been putting for antibiotic ointment, and also cleaned it with peroxide.  Denies any fever, no prior history of diabetes, no decrease in strength of his right index finger.  The history is provided by the patient.      History reviewed. No pertinent past medical history.  There are no problems to display for this patient.   History reviewed. No pertinent surgical history.     History reviewed. No pertinent family history.  Social History   Tobacco Use   Smoking status: Some Days    Packs/day: 0.00    Pack years: 0.00    Types: Cigarettes, Cigars   Smokeless tobacco: Never  Vaping Use   Vaping Use: Never used  Substance Use Topics   Alcohol use: Yes    Comment: occasional   Drug use: Not Currently    Home Medications Prior to Admission medications   Medication Sig Start Date End Date Taking? Authorizing Provider  EPINEPHrine (EPIPEN 2-PAK) 0.3 mg/0.3 mL IJ SOAJ injection Inject 0.3 mLs (0.3 mg total) into the muscle as needed for anaphylaxis. 11/30/18   Robinson, [place] N, PA-C  ondansetron (ZOFRAN) 4 MG tablet Take 1 tablet (4 mg total) by mouth every 8 (eight) hours as needed for nausea or vomiting. 12/07/18   Raeford Razor, MD    Allergies    Patient has no known allergies.  Review of Systems   Review of Systems  Constitutional:  Negative for chills and fever.   Musculoskeletal:  Negative for arthralgias.  Skin:  Positive for wound.   Physical Exam Updated Vital Signs BP 138/88 (BP Location: Left Arm)   Pulse 81   Temp 98.8 F (37.1 C) (Oral)   Resp 18   Ht 5\' 11"  (1.803 m)   Wt 113.4 kg   SpO2 98%   BMI 34.87 kg/m   Physical Exam Vitals and nursing note reviewed.  Constitutional:      Appearance: Normal appearance.  HENT:     Head: Normocephalic and atraumatic.     Nose: Nose normal.     Mouth/Throat:     Mouth: Mucous membranes are moist.  Cardiovascular:     Rate and Rhythm: Normal rate.  Pulmonary:     Effort: Pulmonary effort is normal.  Abdominal:     General: Abdomen is flat.  Musculoskeletal:     Right hand: Tenderness present. No swelling, deformity, lacerations or bony tenderness. Normal range of motion. Normal strength. Normal sensation. There is no disruption of two-point discrimination. Normal capillary refill. Normal pulse.     Cervical back: Normal range of motion and neck supple.     Comments: Please see photos attached.  No pus collection, mild swelling noted.  Pulses are present.  Strength is intact with extension and flexion.  Skin:    General: Skin is warm and  dry.     Findings: Erythema present.  Neurological:     Mental Status: He is alert and oriented to person, place, and time.       ED Results / Procedures / Treatments   Labs (all labs ordered are listed, but only abnormal results are displayed) Labs Reviewed - No data to display  EKG None  Radiology No results found.  Procedures Procedures   Medications Ordered in ED Medications - No data to display  ED Course  I have reviewed the triage vital signs and the nursing notes.  Pertinent labs & imaging results that were available during my care of the patient were reviewed by me and considered in my medical decision making (see chart for details).    MDM Rules/Calculators/A&P     Patient presents to the ED status post right finger  injury x days ago.  Reports having his headphones wrapped around his finger, there was pain to the area.  However took some ibuprofen prior to arrival in the ED.  He has had no systemic signs, no fever, no drainage from the wound.  Has normal finger and hand exam.  Please see photos attached. After discussion with my attending, injuries over already 8 hours, no indication for trephination at this time.  He has full range of motion of his finger without any pain.  No drainage or pus noted in the area that I could I&D on today's visit.  We discussed likely nail will fall off, no indication for removal of the nail on today's visit.  He is to continue over-the-counter measurements.  Patient understands and agrees with management, return precautions discussed at length.   Portions of this note were generated with Scientist, clinical (histocompatibility and immunogenetics). Dictation errors may occur despite best attempts at proofreading.  Final Clinical Impression(s) / ED Diagnoses Final diagnoses:  Finger injury, right, initial encounter    Rx / DC Orders ED Discharge Orders     None        Claude Manges, PA-C 08/13/20 1344    Pricilla Loveless, MD 08/14/20 0830

## 2020-08-13 NOTE — Discharge Instructions (Addendum)
You may continue placing neosporin to the index finger. Please keep your wound clean and dry.

## 2020-08-13 NOTE — ED Triage Notes (Signed)
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 Pt reports head phone cord caught on distal finger, bruising and swelling noted. Pt endorses 400mg  ibuprofen at 0930 today with a decrease in pain

## 2021-01-20 IMAGING — DX DG ANKLE COMPLETE 3+V*R*
3 series · 3 of 3 positions shown · non-contrast
Comparison: None.

CLINICAL DATA: Lateral ankle pain since a fall 3 days ago.

EXAM:
RIGHT ANKLE - COMPLETE 3+ VIEW

[ankle ap]
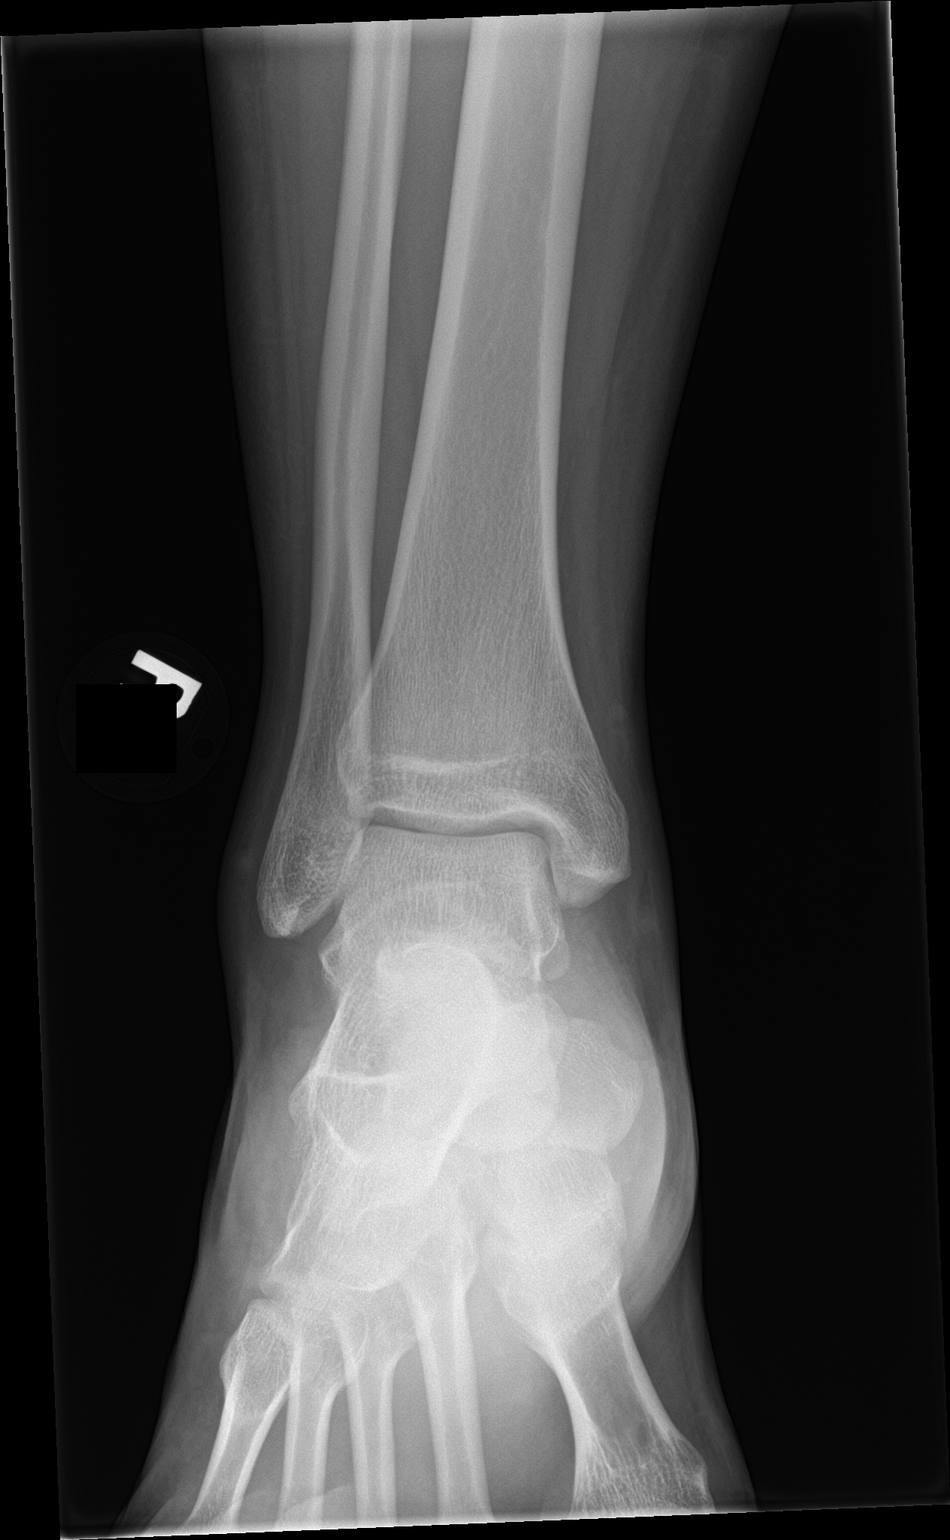

[ankle obl]
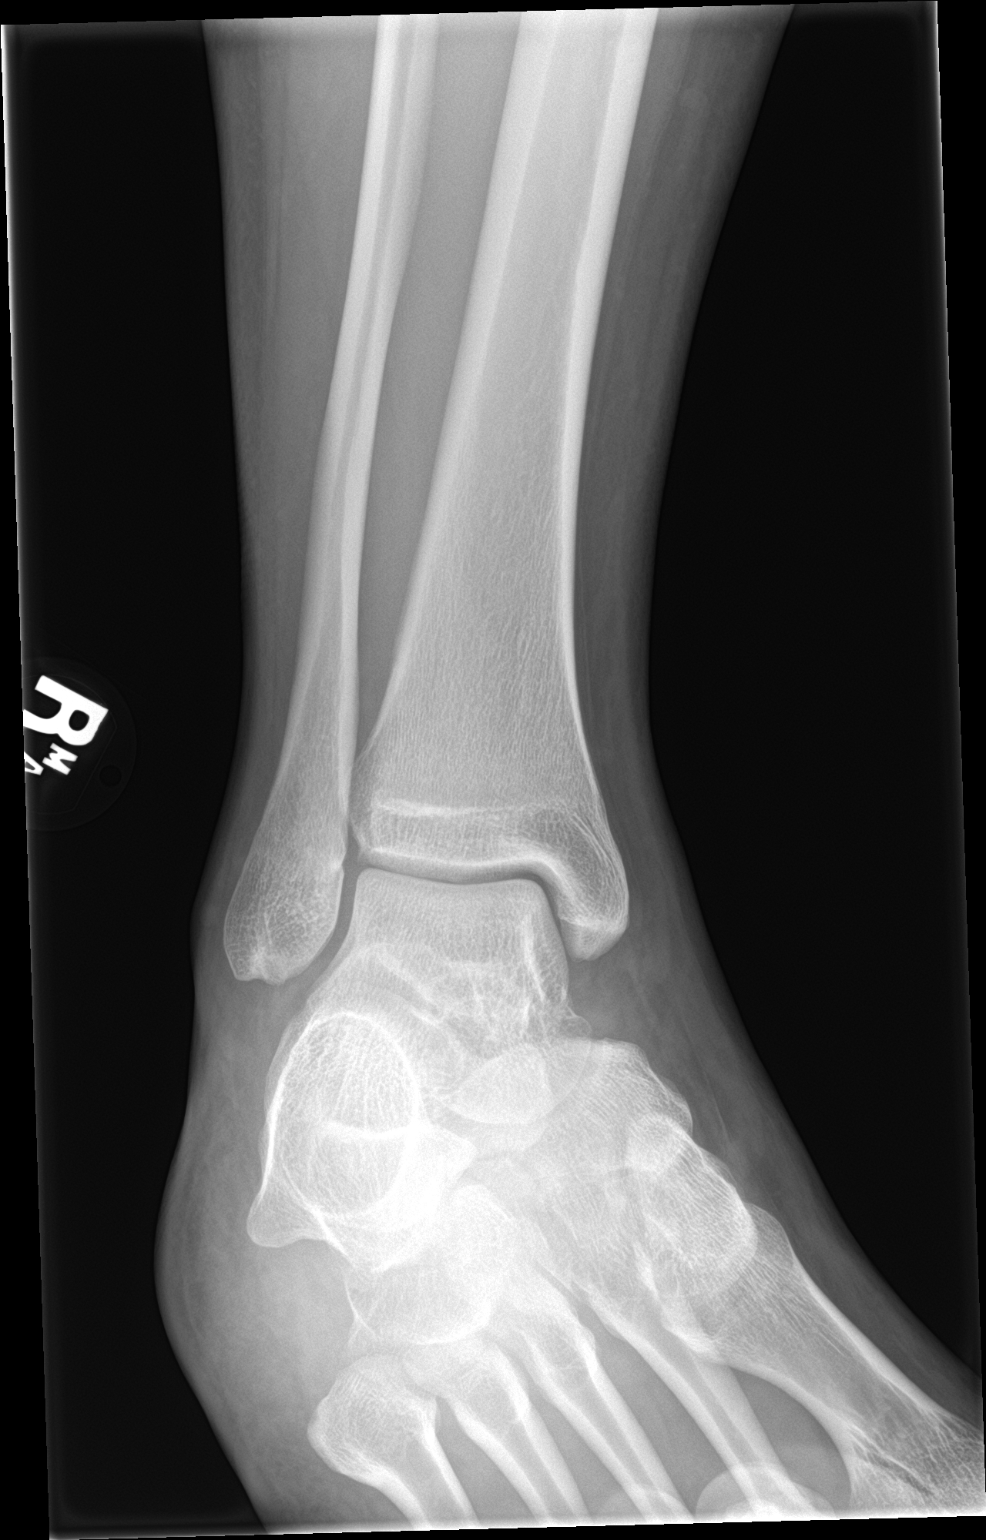

[ankle lat]
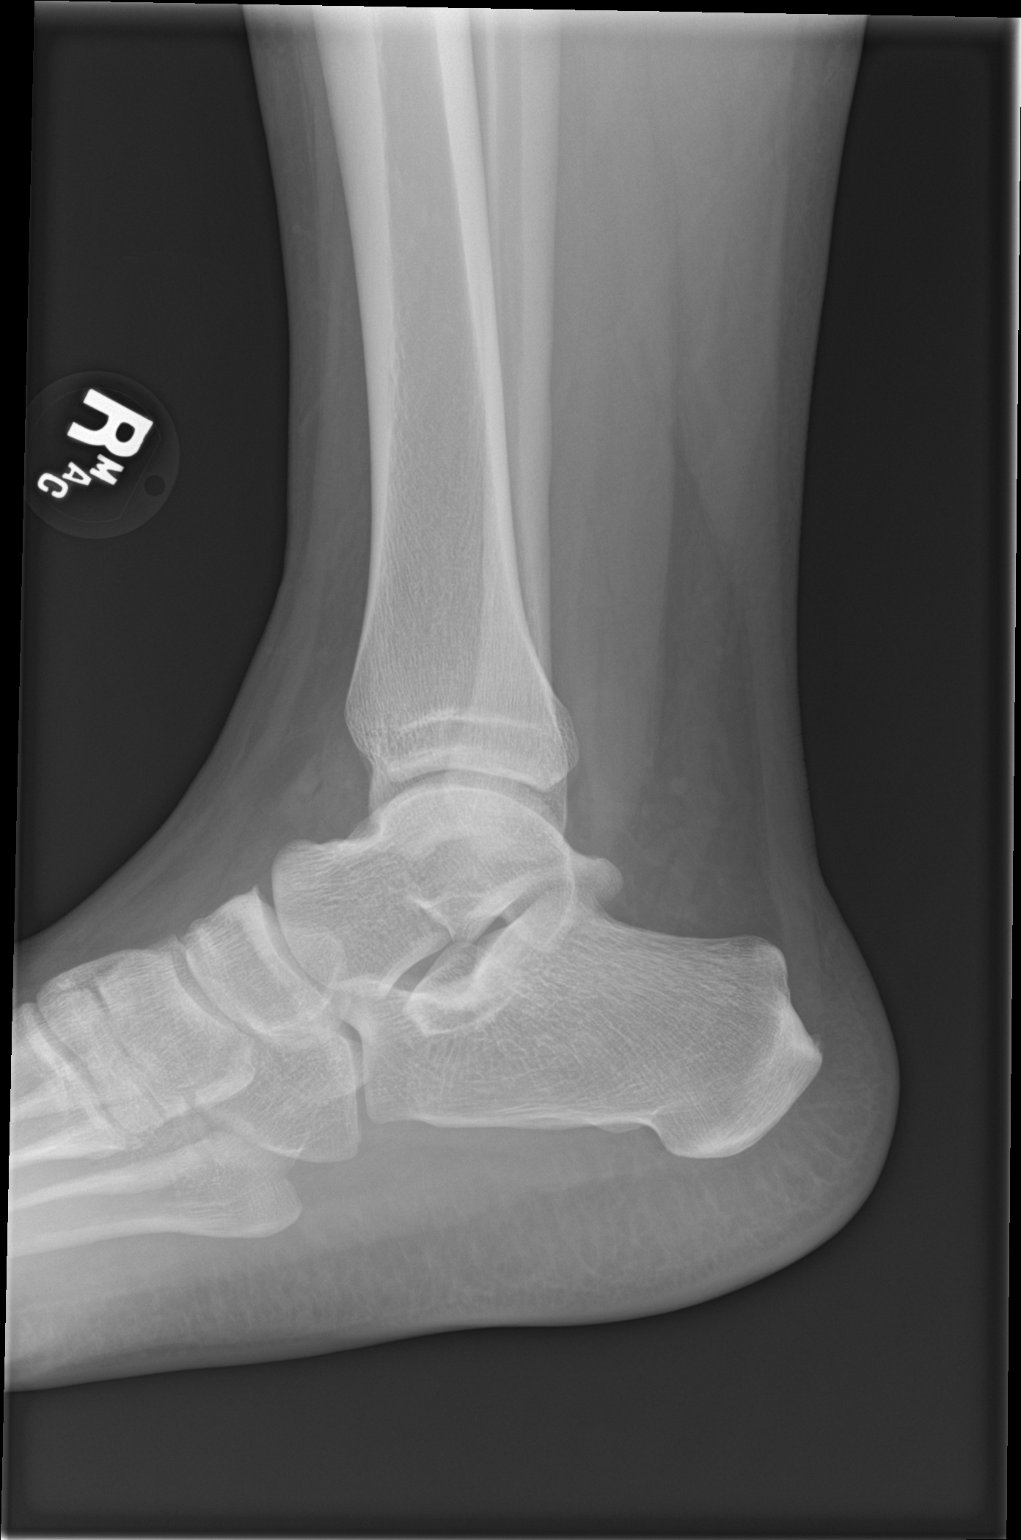

[3 of 3 positions shown; findings below may reference images not displayed]

FINDINGS: There is no evidence of fracture, dislocation, or joint effusion.
There is no evidence of arthropathy or other focal bone abnormality.
Soft tissues are unremarkable.
IMPRESSION: Negative.

## 2022-06-10 ENCOUNTER — Emergency Department (HOSPITAL_BASED_OUTPATIENT_CLINIC_OR_DEPARTMENT_OTHER)
Admission: EM | Admit: 2022-06-10 | Discharge: 2022-06-10 | Disposition: A | Discharge status: Home / Self Care | Payer: 59 | Attending: Emergency Medicine | Admitting: Emergency Medicine

## 2022-06-10 ENCOUNTER — Encounter (HOSPITAL_BASED_OUTPATIENT_CLINIC_OR_DEPARTMENT_OTHER): Payer: Self-pay | Admitting: Urology

## 2022-06-10 DIAGNOSIS — K047 Periapical abscess without sinus: Secondary | ICD-10-CM | POA: Insufficient documentation

## 2022-06-10 DIAGNOSIS — K0889 Other specified disorders of teeth and supporting structures: Secondary | ICD-10-CM | POA: Diagnosis not present

## 2022-06-10 MED ORDER — IBUPROFEN 600 MG PO TABS: 600.0000 mg | ORAL_TABLET | Freq: Four times a day (QID) | ORAL | 0 refills | Status: AC | PRN

## 2022-06-10 MED ORDER — IBUPROFEN 800 MG PO TABS
800.0000 mg | ORAL_TABLET | Freq: Once | ORAL | Status: AC
  Administered 2022-06-10: 800 mg via ORAL
  Filled 2022-06-10: qty 1

## 2022-06-10 MED ORDER — CLINDAMYCIN HCL 150 MG PO CAPS
300.0000 mg | ORAL_CAPSULE | Freq: Once | ORAL | Status: AC
  Administered 2022-06-10: 300 mg via ORAL
  Filled 2022-06-10: qty 2

## 2022-06-10 MED ORDER — CLINDAMYCIN HCL 150 MG PO CAPS: 150.0000 mg | ORAL_CAPSULE | Freq: Four times a day (QID) | ORAL | 0 refills | Status: AC

## 2022-06-10 NOTE — ED Triage Notes (Signed)
Left sided wisdom teeth pain that started on Friday  Worsening since last night, facial swelling noted

## 2022-06-10 NOTE — ED Notes (Signed)
Discharge paperwork reviewed entirely with patient, including Rx's and follow up care. Pain was under control. Pt verbalized understanding as well as all parties involved. No questions or concerns voiced at the time of discharge. No acute distress noted.   Pt ambulated out to PVA without incident or assistance.  

## 2022-06-10 NOTE — ED Provider Notes (Signed)
[facility]ENC[facility] DEPARTMENT AT [facility] Provider Note   CSN: [id number] Arrival date & time: 06/10/22  1740     History  Chief Complaint  Patient presents with   Dental Problem    Cory Tanner is a 32 y.o. male.  The history is provided by the patient and medical records. No language interpreter was used.     32 year old male presenting with complaints of dental pain.  Patient report gradual onset of dental pain ongoing for the past 3 to 4 days.  Pain is a sharp throbbing sensation to his left upper molar and now with facial swelling.  Patient states everything makes it painful.  No fever no neck pain no ear pain or loss of hearing and no injury.  He has had pain to the same tooth in the past.  He has tried over-the-counter medication including Tylenol and ibuprofen with some relief.  He does not have a dentist.  Home Medications Prior to Admission medications   Medication Sig Start Date End Date Taking? Authorizing Provider  EPINEPHrine (EPIPEN 2-PAK) 0.3 mg/0.3 mL IJ SOAJ injection Inject 0.3 mLs (0.3 mg total) into the muscle as needed for anaphylaxis. 11/30/18   Robinson, [place] N, PA-C  ondansetron (ZOFRAN) 4 MG tablet Take 1 tablet (4 mg total) by mouth every 8 (eight) hours as needed for nausea or vomiting. 12/07/18   Raeford Razor, MD      Allergies    Patient has no known allergies.    Review of Systems   Review of Systems  All other systems reviewed and are negative.   Physical Exam Updated Vital Signs BP (!) 138/94 (BP Location: Left Arm)   Pulse 97   Temp 98.7 F (37.1 C) (Oral)   Resp 18   Ht 5\' 11"  (1.803 m)   Wt 97.5 kg   SpO2 97%   BMI 29.99 kg/m  Physical Exam Vitals and nursing note reviewed.  Constitutional:      General: He is not in acute distress.    Appearance: He is well-developed.  HENT:     Head: Atraumatic.     Mouth/Throat:     Comments: Mouth: Dental decay noted to tooth #16 with tenderness to palpation.   Adjacent facial swelling noted.  No skin erythema or warmth. Eyes:     Conjunctiva/sclera: Conjunctivae normal.  Musculoskeletal:     Cervical back: Neck supple.  Skin:    Findings: No rash.  Neurological:     Mental Status: He is alert.     ED Results / Procedures / Treatments   Labs (all labs ordered are listed, but only abnormal results are displayed) Labs Reviewed - No data to display  EKG None  Radiology No results found.  Procedures Procedures    Medications Ordered in ED Medications  ibuprofen (ADVIL) tablet 800 mg (has no administration in time range)  clindamycin (CLEOCIN) capsule 300 mg (has no administration in time range)    ED Course/ Medical Decision Making/ A&P                             Medical Decision Making  BP (!) 138/94 (BP Location: Left Arm)   Pulse 97   Temp 98.7 F (37.1 C) (Oral)   Resp 18   Ht 5\' 11"  (1.803 m)   Wt 97.5 kg   SpO2 97%   BMI 29.99 kg/m   20:14 PM 32 year old male presenting with complaints  of dental pain.  Patient report gradual onset of dental pain ongoing for the past 3 to 4 days.  Pain is a sharp throbbing sensation to his left upper molar and now with facial swelling.  Patient states everything makes it painful.  No fever no neck pain no ear pain or loss of hearing and no injury.  He has had pain to the same tooth in the past.  He has tried over-the-counter medication including Tylenol and ibuprofen with some relief.  He does not have a dentist.  On exam, patient appears uncomfortable.  Facial swelling noted.  Swelling is noted to the left side of face.  Patient does have dental decay noted to tooth #16 likely the source of his odontogenic infection.  No obvious signs of preseptal or orbital cellulitis.  I have low suspicion for facial fracture as patient denies any injury.  Doubt ear infection.  I have considered a maxillofacial CT scan to assess for deep tissue infection but did not perform due to low suspicion for  deep infection.  Labs are considered however vital signs stable and I felt patient can be treated with antibiotic, antinausea medication and outpatient follow-up with oral surgeon for further care.  Return precaution given.  Patient was giving ibuprofen and clindamycin in ED with improvement of symptoms.        Final Clinical Impression(s) / ED Diagnoses Final diagnoses:  Periapical abscess with facial involvement    Rx / DC Orders ED Discharge Orders          Ordered    clindamycin (CLEOCIN) 150 MG capsule  Every 6 hours        06/10/22 1910    ibuprofen (ADVIL) 600 MG tablet  Every 6 hours PRN        06/10/22 1910              Fayrene Helper, PA-C 06/10/22 1912    Charlynne Pander, MD 06/10/22 2016

## 2023-07-26 ENCOUNTER — Other Ambulatory Visit: Payer: Self-pay

## 2023-07-26 ENCOUNTER — Encounter (HOSPITAL_COMMUNITY): Payer: Self-pay

## 2023-07-26 ENCOUNTER — Emergency Department (HOSPITAL_COMMUNITY)
Admission: EM | Admit: 2023-07-26 | Discharge: 2023-07-26 | Disposition: A | Discharge status: Home / Self Care | Payer: Self-pay | Attending: Emergency Medicine | Admitting: Emergency Medicine

## 2023-07-26 DIAGNOSIS — F1020 Alcohol dependence, uncomplicated: Secondary | ICD-10-CM

## 2023-07-26 DIAGNOSIS — F10920 Alcohol use, unspecified with intoxication, uncomplicated: Secondary | ICD-10-CM

## 2023-07-26 DIAGNOSIS — F10120 Alcohol abuse with intoxication, uncomplicated: Secondary | ICD-10-CM | POA: Insufficient documentation

## 2023-07-26 HISTORY — DX: Alcohol dependence, uncomplicated: F10.20

## 2023-07-26 NOTE — ED Notes (Signed)
 Pt stood up, put hoodie on and walked out EMS bay door. Gait steady

## 2023-07-26 NOTE — ED Provider Notes (Signed)
 [place] EMERGENCY DEPARTMENT AT [facility] Provider Note   CSN: [id number] Arrival date & time: 07/26/23  0356     History  Chief Complaint  Patient presents with   Alcohol Intoxication    Cory Tanner is a 33 y.o. male.  34 year old male presents via EMS for intoxication.  Patient states that he drank 1 to many tonight.  He has no complaints otherwise.  Per EMS, patient was out drinking with friends who admitted him at a gas station which upset him.       Home Medications Prior to Admission medications   Medication Sig Start Date End Date Taking? Authorizing Provider  clindamycin  (CLEOCIN ) 150 MG capsule Take 1 capsule (150 mg total) by mouth every 6 (six) hours. 06/10/22   Debbra Fairy, PA-C  EPINEPHrine  (EPIPEN  2-PAK) 0.3 mg/0.3 mL IJ SOAJ injection Inject 0.3 mLs (0.3 mg total) into the muscle as needed for anaphylaxis. 11/30/18   Robinson, [place] N, PA-C  ibuprofen  (ADVIL ) 600 MG tablet Take 1 tablet (600 mg total) by mouth every 6 (six) hours as needed. 06/10/22   Debbra Fairy, PA-C  ondansetron  (ZOFRAN ) 4 MG tablet Take 1 tablet (4 mg total) by mouth every 8 (eight) hours as needed for nausea or vomiting. 12/07/18   Bart Born, MD      Allergies    Patient has no known allergies.    Review of Systems   Review of Systems Negative except as per HPI Physical Exam Updated Vital Signs BP (!) 126/97 (BP Location: Right Arm)   Pulse 99   Temp 98.8 F (37.1 C) (Oral)   Resp 17   Ht 5\' 11"  (1.803 m)   Wt 97.5 kg   SpO2 97%   BMI 29.98 kg/m  Physical Exam Vitals and nursing note reviewed.  Constitutional:      General: He is not in acute distress.    Appearance: He is well-developed. He is not diaphoretic.  HENT:     Head: Normocephalic and atraumatic.  Cardiovascular:     Rate and Rhythm: Normal rate and regular rhythm.     Heart sounds: Normal heart sounds.  Pulmonary:     Effort: Pulmonary effort is normal.     Breath sounds: Normal breath  sounds.  Abdominal:     Palpations: Abdomen is soft.     Tenderness: There is no abdominal tenderness.  Musculoskeletal:     Right lower leg: No edema.     Left lower leg: No edema.  Skin:    General: Skin is warm and dry.     Findings: No erythema or rash.  Neurological:     Mental Status: He is alert and oriented to person, place, and time.  Psychiatric:        Behavior: Behavior normal.     ED Results / Procedures / Treatments   Labs (all labs ordered are listed, but only abnormal results are displayed) Labs Reviewed - No data to display  EKG None  Radiology No results found.  Procedures Procedures    Medications Ordered in ED Medications - No data to display  ED Course/ Medical Decision Making/ A&P                                 Medical Decision Making  33 year old male presents via EMS.  Patient states that 1 to manage drink tonight and his needs to sleep it off.  He denies any needs or requests otherwise at this time.  Plan is for patient to sleep on likely discharge home in the morning when he is clinically sober or with a safe ride.        Final Clinical Impression(s) / ED Diagnoses Final diagnoses:  Alcoholic intoxication without complication [facility])    Rx / DC Orders ED Discharge Orders     None         Darlis Eisenmenger, PA-C 07/26/23 0500    Earma Gloss, MD 07/26/23 [phone]

## 2023-07-26 NOTE — ED Triage Notes (Signed)
 Pt was picked up at [facility], intoxicated and abandoned by coworkers. States he needs some help with his drinking.

## 2023-07-26 NOTE — ED Notes (Signed)
 Pt sleeping. Discharge postponed until pt wakes up.
